# Patient Record
Sex: Male | Born: 2000 | Race: Black or African American | Hispanic: No | Marital: Single | Smoking: Never smoker
Health system: Southern US, Community
[De-identification: ages and names within clinical notes are randomized; demographics above are authoritative.]

## PROBLEM LIST (undated history)

## (undated) HISTORY — PX: WISDOM TOOTH EXTRACTION: SHX21

## (undated) HISTORY — PX: CIRCUMCISION: SHX1350

---
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
## 2018-05-29 DIAGNOSIS — Z00129 Encounter for routine child health examination without abnormal findings: Secondary | ICD-10-CM | POA: Diagnosis not present

## 2018-09-29 ENCOUNTER — Ambulatory Visit: Payer: Medicaid Other | Admitting: Family Medicine

## 2018-10-20 ENCOUNTER — Ambulatory Visit (INDEPENDENT_AMBULATORY_CARE_PROVIDER_SITE_OTHER): Payer: BLUE CROSS/BLUE SHIELD | Admitting: Family Medicine

## 2018-10-20 ENCOUNTER — Encounter: Payer: Self-pay | Admitting: Family Medicine

## 2018-10-20 VITALS — BP 126/70 | HR 72 | Temp 98.4°F | Ht 67.0 in | Wt 160.0 lb

## 2018-10-20 DIAGNOSIS — F419 Anxiety disorder, unspecified: Secondary | ICD-10-CM

## 2018-10-20 DIAGNOSIS — F32A Depression, unspecified: Secondary | ICD-10-CM

## 2018-10-20 DIAGNOSIS — F329 Major depressive disorder, single episode, unspecified: Secondary | ICD-10-CM

## 2018-10-20 DIAGNOSIS — Z7689 Persons encountering health services in other specified circumstances: Secondary | ICD-10-CM

## 2018-10-20 DIAGNOSIS — Z23 Encounter for immunization: Secondary | ICD-10-CM

## 2018-10-20 NOTE — Patient Instructions (Addendum)
Living With Anxiety    After being diagnosed with an anxiety disorder, you may be relieved to know why you have felt or behaved a certain way. It is natural to also feel overwhelmed about the treatment ahead and what it will mean for your life. With care and support, you can manage this condition and recover from it.  How to cope with anxiety  Dealing with stress  Stress is your body's reaction to life changes and events, both good and bad. Stress can last just a few hours or it can be ongoing. Stress can play a major role in anxiety, so it is important to learn both how to cope with stress and how to think about it differently.  Talk with your health care provider or a counselor to learn more about stress reduction. He or she may suggest some stress reduction techniques, such as:  Music therapy. This can include creating or listening to music that you enjoy and that inspires you.  Mindfulness-based meditation. This involves being aware of your normal breaths, rather than trying to control your breathing. It can be done while sitting or walking.  Centering prayer. This is a kind of meditation that involves focusing on a word, phrase, or sacred image that is meaningful to you and that brings you peace.  Deep breathing. To do this, expand your stomach and inhale slowly through your nose. Hold your breath for 3-5 seconds. Then exhale slowly, allowing your stomach muscles to relax.  Self-talk. This is a skill where you identify thought patterns that lead to anxiety reactions and correct those thoughts.  Muscle relaxation. This involves tensing muscles then relaxing them.  Choose a stress reduction technique that fits your lifestyle and personality. Stress reduction techniques take time and practice. Set aside 5-15 minutes a day to do them. Therapists can offer training in these techniques. The training may be covered by some insurance plans. Other things you can do to manage stress include:  Keeping a stress diary. This  can help you learn what triggers your stress and ways to control your response.  Thinking about how you respond to certain situations. You may not be able to control everything, but you can control your reaction.  Making time for activities that help you relax, and not feeling guilty about spending your time in this way.  Therapy combined with coping and stress-reduction skills provides the best chance for successful treatment.  Medicines  Medicines can help ease symptoms. Medicines for anxiety include:  Anti-anxiety drugs.  Antidepressants.  Beta-blockers.  Medicines may be used as the main treatment for anxiety disorder, along with therapy, or if other treatments are not working. Medicines should be prescribed by a health care provider.  Relationships  Relationships can play a big part in helping you recover. Try to spend more time connecting with trusted friends and family members. Consider going to couples counseling, taking family education classes, or going to family therapy. Therapy can help you and others better understand the condition.  How to recognize changes in your condition  Everyone has a different response to treatment for anxiety. Recovery from anxiety happens when symptoms decrease and stop interfering with your daily activities at home or work. This may mean that you will start to:  Have better concentration and focus.  Sleep better.  Be less irritable.  Have more energy.  Have improved memory.  It is important to recognize when your condition is getting worse. Contact your health care provider if your symptoms   interfere with home or work and you do not feel like your condition is improving.  Where to find help and support:  You can get help and support from these sources:  Self-help groups.  Online and community organizations.  A trusted spiritual leader.  Couples counseling.  Family education classes.  Family therapy.  Follow these instructions at home:  Eat a healthy diet that includes plenty  of vegetables, fruits, whole grains, low-fat dairy products, and lean protein. Do not eat a lot of foods that are high in solid fats, added sugars, or salt.  Exercise. Most adults should do the following:  Exercise for at least 150 minutes each week. The exercise should increase your heart rate and make you sweat (moderate-intensity exercise).  Strengthening exercises at least twice a week.  Cut down on caffeine, tobacco, alcohol, and other potentially harmful substances.  Get the right amount and quality of sleep. Most adults need 7-9 hours of sleep each night.  Make choices that simplify your life.  Take over-the-counter and prescription medicines only as told by your health care provider.  Avoid caffeine, alcohol, and certain over-the-counter cold medicines. These may make you feel worse. Ask your pharmacist which medicines to avoid.  Keep all follow-up visits as told by your health care provider. This is important.  Questions to ask your health care provider  Would I benefit from therapy?  How often should I follow up with a health care provider?  How long do I need to take medicine?  Are there any long-term side effects of my medicine?  Are there any alternatives to taking medicine?  Contact a health care provider if:  You have a hard time staying focused or finishing daily tasks.  You spend many hours a day feeling worried about everyday life.  You become exhausted by worry.  You start to have headaches, feel tense, or have nausea.  You urinate more than normal.  You have diarrhea.  Get help right away if:  You have a racing heart and shortness of breath.  You have thoughts of hurting yourself or others.  If you ever feel like you may hurt yourself or others, or have thoughts about taking your own life, get help right away. You can go to your nearest emergency department or call:  Your local emergency services (911 in the [place]  A suicide crisis helpline, such as the National Suicide Prevention Lifeline at  1-800-273-8255. This is open 24-hours a day.  Summary  Taking steps to deal with stress can help calm you.  Medicines cannot cure anxiety disorders, but they can help ease symptoms.  Family, friends, and partners can play a big part in helping you recover from an anxiety disorder.  This information is not intended to replace advice given to you by your health care provider. Make sure you discuss any questions you have with your health care provider.  Document Released: 09/24/2016 Document Revised: 09/24/2016 Document Reviewed: 09/24/2016  Elsevier Interactive Patient Education  2019 [employer]  Living With Depression  Everyone experiences occasional disappointment, sadness, and loss in their lives. When you are feeling down, blue, or sad for at least 2 weeks in a row, it may mean that you have depression. Depression can affect your thoughts and feelings, relationships, daily activities, and physical health. It is caused by changes in the way your brain functions. If you receive a diagnosis of depression, your health care provider will tell you which type of depression you have and what   treatment options are available to you.  If you are living with depression, there are ways to help you recover from it and also ways to prevent it from coming back.  How to cope with lifestyle changes  Coping with stress         Stress is your body's reaction to life changes and events, both good and bad. Stressful situations may include:  Getting married.  The death of a spouse.  Losing a job.  Retiring.  Having a baby.  Stress can last just a few hours or it can be ongoing. Stress can play a major role in depression, so it is important to learn both how to cope with stress and how to think about it differently.  Talk with your health care provider or a counselor if you would like to learn more about stress reduction. He or she may suggest some stress reduction techniques, such as:  Music therapy. This can include creating music  or listening to music. Choose music that you enjoy and that inspires you.  Mindfulness-based meditation. This kind of meditation can be done while sitting or walking. It involves being aware of your normal breaths, rather than trying to control your breathing.  Centering prayer. This is a kind of meditation that involves focusing on a spiritual word or phrase. Choose a word, phrase, or sacred image that is meaningful to you and that brings you peace.  Deep breathing. To do this, expand your stomach and inhale slowly through your nose. Hold your breath for 3-5 seconds, then exhale slowly, allowing your stomach muscles to relax.  Muscle relaxation. This involves intentionally tensing muscles then relaxing them.  Choose a stress reduction technique that fits your lifestyle and personality. Stress reduction techniques take time and practice to develop. Set aside 5-15 minutes a day to do them. Therapists can offer training in these techniques. The training may be covered by some insurance plans. Other things you can do to manage stress include:  Keeping a stress diary. This can help you learn what triggers your stress and ways to control your response.  Understanding what your limits are and saying no to requests or events that lead to a schedule that is too full.  Thinking about how you respond to certain situations. You may not be able to control everything, but you can control how you react.  Adding humor to your life by watching funny films or TV shows.  Making time for activities that help you relax and not feeling guilty about spending your time this way.    Medicines  Your health care provider may suggest certain medicines if he or she feels that they will help improve your condition. Avoid using alcohol and other substances that may prevent your medicines from working properly (may interact). It is also important to:  Talk with your pharmacist or health care provider about all the medicines that you take, their  possible side effects, and what medicines are safe to take together.  Make it your goal to take part in all treatment decisions (shared decision-making). This includes giving input on the side effects of medicines. It is best if shared decision-making with your health care provider is part of your total treatment plan.  If your health care provider prescribes a medicine, you may not notice the full benefits of it for 4-8 weeks. Most people who are treated for depression need to be on medicine for at least 6-12 months after they feel better. If you are   taking medicines as part of your treatment, do not stop taking medicines without first talking to your health care provider. You may need to have the medicine slowly decreased (tapered) over time to decrease the risk of harmful side effects.  Relationships  Your health care provider may suggest family therapy along with individual therapy and drug therapy. While there may not be family problems that are causing you to feel depressed, it is still important to make sure your family learns as much as they can about your mental health. Having your family's support can help make your treatment successful.  How to recognize changes in your condition  Everyone has a different response to treatment for depression. Recovery from major depression happens when you have not had signs of major depression for two months. This may mean that you will start to:  Have more interest in doing activities.  Feel less hopeless than you did 2 months ago.  Have more energy.  Overeat less often, or have better or improving appetite.  Have better concentration.  Your health care provider will work with you to decide the next steps in your recovery. It is also important to recognize when your condition is getting worse. Watch for these signs:  Having fatigue or low energy.  Eating too much or too little.  Sleeping too much or too little.  Feeling restless, agitated, or hopeless.  Having trouble  concentrating or making decisions.  Having unexplained physical complaints.  Feeling irritable, angry, or aggressive.  Get help as soon as you or your family members notice these symptoms coming back.  How to get support and help from others  How to talk with friends and family members about your condition    Talking to friends and family members about your condition can provide you with one way to get support and guidance. Reach out to trusted friends or family members, explain your symptoms to them, and let them know that you are working with a health care provider to treat your depression.  Financial resources  Not all insurance plans cover mental health care, so it is important to check with your insurance carrier. If paying for co-pays or counseling services is a problem, search for a local or county mental health care center. They may be able to offer public mental health care services at low or no cost when you are not able to see a private health care provider.  If you are taking medicine for depression, you may be able to get the generic form, which may be less expensive. Some makers of prescription medicines also offer help to patients who cannot afford the medicines they need.  Follow these instructions at home:    Get the right amount and quality of sleep.  Cut down on using caffeine, tobacco, alcohol, and other potentially harmful substances.  Try to exercise, such as walking or lifting small weights.  Take over-the-counter and prescription medicines only as told by your health care provider.  Eat a healthy diet that includes plenty of vegetables, fruits, whole grains, low-fat dairy products, and lean protein. Do not eat a lot of foods that are high in solid fats, added sugars, or salt.  Keep all follow-up visits as told by your health care provider. This is important.  Contact a health care provider if:  You stop taking your antidepressant medicines, and you have any of these  symptoms:  Nausea.  Headache.  Feeling lightheaded.  Chills and body aches.  Not being able   to sleep (insomnia).  You or your friends and family think your depression is getting worse.  Get help right away if:  You have thoughts of hurting yourself or others.  If you ever feel like you may hurt yourself or others, or have thoughts about taking your own life, get help right away. You can go to your nearest emergency department or call:  Your local emergency services (911 in the [place]  A suicide crisis helpline, such as the National Suicide Prevention Lifeline at 1-800-273-8255. This is open 24-hours a day.  Summary  If you are living with depression, there are ways to help you recover from it and also ways to prevent it from coming back.  Work with your health care team to create a management plan that includes counseling, stress management techniques, and healthy lifestyle habits.  This information is not intended to replace advice given to you by your health care provider. Make sure you discuss any questions you have with your health care provider.  Document Released: 09/02/2016 Document Revised: 09/02/2016 Document Reviewed: 09/02/2016  [employer]vier Interactive Patient Education  2019 [employer]

## 2018-10-20 NOTE — Progress Notes (Signed)
Patient presents to clinic today to f/u on ongoing concerns and establish care.  Pt is accompanied by his mother.  SUBJECTIVE: PMH: Pt is a 18 yo male with no sig pmh.  Pt was previously seen at [facility].  Last CPE August 2019.  Concerns about anxiety: -Per mom patient has an appointment tomorrow with a psychiatrist. -endorses occasional difficulty sleeping. -States may wake up at 4 in the morning or have difficulty falling asleep. -Patient notes his mind racing when trying to fall asleep -endorses overall good mood, energy is ok -does feel like anxiety is affecting his school work.  Allergies: NKDA  Past surgical history: None  Social history: Patient is single.  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  Patient denies alcohol, tobacco, drug use.  Health Maintenance: Immunizations --requesting influenza vaccine this visit  Family medical history: Mom-Alive, HTN Dad-Alive, history of cancer on his back that started as a keloid, status post removal.  HTN Sister, Rolm Gala, alive, asthma   History reviewed. No pertinent past medical history.  Past Surgical History:  Procedure Laterality Date  . CIRCUMCISION    . WISDOM TOOTH EXTRACTION      No current outpatient medications on file prior to visit.   No current facility-administered medications on file prior to visit.     No Known Allergies  Family History  Problem Relation Age of Onset  . Hypertension Father   . Chiari malformation Sister   . Headache Sister     Social History   Socioeconomic History  . Marital status: Single    Spouse name: Not on file  . Number of children: Not on file  . Years of education: Not on file  . Highest education level: Not on file  Occupational History  . Not on file  Social Needs  . Financial resource strain: Not on file  . Food insecurity:    Worry: Not on file    Inability: Not on file  .  Transportation needs:    Medical: Not on file    Non-medical: Not on file  Tobacco Use  . Smoking status: Never Smoker  . Smokeless tobacco: Never Used  Substance and Sexual Activity  . Alcohol use: No  . Drug use: No  . Sexual activity: Never  Lifestyle  . Physical activity:    Days per week: Not on file    Minutes per session: Not on file  . Stress: Not on file  Relationships  . Social connections:    Talks on phone: Not on file    Gets together: Not on file    Attends religious service: Not on file    Active member of club or organization: Not on file    Attends meetings of clubs or organizations: Not on file    Relationship status: Not on file  . Intimate partner violence:    Fear of current or ex partner: Not on file    Emotionally abused: Not on file    Physically abused: Not on file    Forced sexual activity: Not on file  Other Topics Concern  . Not on file  Social History Narrative   Yakub is in tenth grade at [employer]. He is doing well. He runs Track and plays Soccer.   Lives with his parents and younger sister.     ROS General: Denies fever, chills, night sweats, changes in weight, changes in appetite HEENT: Denies headaches, ear pain, changes in vision,  rhinorrhea, sore throat CV: Denies CP, palpitations, SOB, orthopnea Pulm: Denies SOB, cough, wheezing GI: Denies abdominal pain, nausea, vomiting, diarrhea, constipation GU: Denies dysuria, hematuria, frequency, vaginal discharge Msk: Denies muscle cramps, joint pains Neuro: Denies weakness, numbness, tingling Skin: Denies rashes, bruising Psych: Denies hallucinations  + anxiety and depression  BP 126/70 (BP Location: Left Arm, Patient Position: Sitting, Cuff Size: Normal)   Pulse 72   Temp 98.4 F (36.9 C) (Oral)   Ht 5\' 7"  (1.702 m)   Wt 160 lb (72.6 kg)   SpO2 98%   BMI 25.06 kg/m   Physical Exam Gen. Pleasant, well developed, well-nourished, in NAD HEENT - [place]/AT, PERRL, EOMI, conjunctive  clear, no scleral icterus, no nasal drainage, pharynx without erythema or exudate. Lungs: no use of accessory muscles, CTAB, no wheezes, rales or rhonchi Cardiovascular: RRR, No r/g/m, no peripheral edema Neuro:  A&Ox3, CN II-XII intact, normal gait Skin:  Warm, dry, intact, no lesions  No results found for this or any previous visit (from the past 2160 hour(s)).  Assessment/Plan: Anxiety and depression -PHQ 9 score 16 -Gad 7 score 15 -Discussed various options including therapy and medication -Patient has an appointment tomorrow with psychiatry.  Encouraged to keep this appointment. -Given handout on ways to manage stress, anxiety, depression. -Given RTC or ED precautions  Encounter to establish care -We reviewed the PMH, PSH, FH, SH, Meds and Allergies. -We provided refills for any medications we will prescribe as needed. -We addressed current concerns per orders and patient instructions. -We have asked for records for pertinent exams, studies, vaccines and notes from previous providers. -We have advised patient to follow up per instructions below.  Need for immunization against influenza  - Plan: Flu Vaccine QUAD 36+ mos IM   Follow-up in the next few months when patient is on spring break.  Abbe AmsterdamShannon Banks, MD

## 2019-01-21 ENCOUNTER — Other Ambulatory Visit: Payer: Self-pay

## 2019-01-21 ENCOUNTER — Ambulatory Visit (INDEPENDENT_AMBULATORY_CARE_PROVIDER_SITE_OTHER): Payer: BLUE CROSS/BLUE SHIELD | Admitting: Family Medicine

## 2019-01-21 ENCOUNTER — Encounter: Payer: Self-pay | Admitting: Family Medicine

## 2019-01-21 DIAGNOSIS — L700 Acne vulgaris: Secondary | ICD-10-CM

## 2019-01-21 MED ORDER — BENZOYL PEROXIDE 2.5 % EX LIQD: CUTANEOUS | 1 refills | Status: AC

## 2019-01-21 NOTE — Progress Notes (Signed)
Virtual Visit via Video Note  I connected with Edwin Castro on 01/21/19 at  2:30 PM EDT by a video enabled telemedicine application and verified that I am speaking with the correct person using two identifiers.  Location patient: home Location provider:home office Persons participating in the virtual visit: patient, provider  I discussed the limitations of evaluation and management by telemedicine and the availability of in person appointments. The patient expressed understanding and agreed to proceed.   HPI: Pt with increased acne since being home from college 2/2 COVID-19.  R side of face worse.  Notes painful pustules and dark marks on skin.  Drinking more water.  Not drinking sodas Tried numerous OTC products.  A few seemed to make skin worse.  Pt has been dealing with acne for some time.  In the past saw Dermatology.  Pt does note some increased stress.  Adjusting to doing work online/being home.    ROS: See pertinent positives and negatives per HPI.  No past medical history on file.  Past Surgical History:  Procedure Laterality Date  . CIRCUMCISION    . WISDOM TOOTH EXTRACTION      Family History  Problem Relation Age of Onset  . Hypertension Father   . Chiari malformation Sister   . 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  No current outpatient medications on file.  EXAM:  VITALS per patient if applicable:  RR between 12-20 bpm  GENERAL: alert, oriented, appears well and in no acute distress  HEENT: atraumatic, conjunctiva clear, no obvious abnormalities on inspection of external nose and ears  NECK: normal movements of the head and neck  LUNGS: on inspection no signs of respiratory distress, breathing rate appears normal, no obvious gross SOB, gasping or wheezing  CV: no obvious cyanosis  MS: moves all visible extremities without noticeable abnormality  SKIN:  Numerous closed comedones on forehead and cheeks with  mild erythema.  Several larger pustules on lateral R side of face.    PSYCH/NEURO: pleasant and cooperative, no obvious depression or anxiety, speech and thought processing grossly intact  ASSESSMENT AND PLAN:  Discussed the following assessment and plan:  Acne vulgaris  -discussed cleansing skin with a gentle cleanser such as cetaphil BID and using a good moisturizer. -ok to use vitamin E oil on hyperpigmented areas -continue staying hydrated with water. - Plan: Benzoyl Peroxide 2.5 % LIQD, Ambulatory referral to Dermatology -f/u prn  I discussed the assessment and treatment plan with the patient. The patient was provided an opportunity to ask questions and all were answered. The patient agreed with the plan and demonstrated an understanding of the instructions.   The patient was advised to call back or seek an in-person evaluation if the symptoms worsen or if the condition fails to improve as anticipated.   Deeann Saint, MD

## 2019-01-27 ENCOUNTER — Other Ambulatory Visit: Payer: Self-pay

## 2019-01-27 ENCOUNTER — Telehealth: Payer: Self-pay | Admitting: Family Medicine

## 2019-01-27 ENCOUNTER — Encounter: Payer: Self-pay | Admitting: Family Medicine

## 2019-01-27 ENCOUNTER — Ambulatory Visit: Payer: BLUE CROSS/BLUE SHIELD | Admitting: Family Medicine

## 2019-01-27 DIAGNOSIS — L989 Disorder of the skin and subcutaneous tissue, unspecified: Secondary | ICD-10-CM

## 2019-01-27 DIAGNOSIS — L700 Acne vulgaris: Secondary | ICD-10-CM

## 2019-01-27 DIAGNOSIS — K612 Anorectal abscess: Secondary | ICD-10-CM | POA: Diagnosis not present

## 2019-01-27 NOTE — Progress Notes (Signed)
Virtual Visit via Video Note  I connected with on 01/27/19 at  3:00 PM EDT by a video enabled telemedicine application and verified that I am speaking with the correct person using two identifiers.  Location patient: home Location provider: home office Persons participating in the virtual visit: patient, provider.  Pt's mother nearby.  I discussed the limitations of evaluation and management by telemedicine and the availability of in person appointments. The patient expressed understanding and agreed to proceed.   HPI: Pt with a cyst near his bottom.  States the area is not painful, but more uncomfortable.  Has been present x several days, seems to be getting larger.  Pt denies fever, chills, n/v.  Pt has not tried anything for the area.  Pt's mother notes she was unable to get the rx for benzoyl peroxide from the pharmacy.  States pharmacy said they tried to contact the office.  ROS: See pertinent positives and negatives per HPI.  No past medical history on file.  Past Surgical History:  Procedure Laterality Date  . CIRCUMCISION    . WISDOM TOOTH EXTRACTION      Family History  Problem Relation Age of Onset  . Hypertension Father   . Chiari malformation Sister   . Headache Sister     SOCIAL HX:    Current Outpatient Medications:  .  Benzoyl Peroxide 2.5 % LIQD, Use to wash face daily., Disp: 156 g, Rfl: 1  EXAM:  VITALS per patient if applicable:  RR between 12-20 bpm  GENERAL: alert, oriented, appears well and in no acute distress  HEENT: atraumatic, conjunctiva clear, no obvious abnormalities on inspection of external nose and ears  NECK: normal movements of the head and neck  LUNGS: on inspection no signs of respiratory distress, breathing rate appears normal, no obvious gross SOB, gasping or wheezing  CV: no obvious cyanosis  MS: moves all visible extremities without noticeable abnormality  PSYCH/NEURO: pleasant and cooperative, no obvious depression or  anxiety, speech and thought processing grossly intact  ASSESSMENT AND PLAN:  Discussed the following assessment and plan:  Skin lesion  -possible cyst vs abscess. -discussed may need I&D -will see pt in office tomorrow at 3:00pm to assess lesion  Pt's pharmacy called.  Benzoyl peroxide not available, but panoxyl peroxide OTC.  Will let pt know.  I discussed the assessment and treatment plan with the patient. The patient was provided an opportunity to ask questions and all were answered. The patient agreed with the plan and demonstrated an understanding of the instructions.   The patient was advised to call back or seek an in-person evaluation if the symptoms worsen or if the condition fails to improve as anticipated.   Deeann Saint, MD

## 2019-01-27 NOTE — Telephone Encounter (Unsigned)
Copied from CRM [id number]. Topic: Quick Communication - Rx Refill/Question >> Jan 27, 2019 11:41 AM [name] E wrote: Medication: Benzoyl Peroxide 2.5 % LIQD   Has the patient contacted their pharmacy? Yes- Pharmacy doesn't have any of this med and needs an alternative sent in/Pharmacy stated they attempted to contact office with no response/ please advise   Preferred Pharmacy (with phone number or street name):   Agent: Please be advised that RX refills may take up to 3 business days. We ask that you follow-up with your pharmacy.

## 2019-01-28 ENCOUNTER — Other Ambulatory Visit: Payer: Self-pay

## 2019-01-28 ENCOUNTER — Encounter: Payer: Self-pay | Admitting: Family Medicine

## 2019-01-28 ENCOUNTER — Ambulatory Visit (INDEPENDENT_AMBULATORY_CARE_PROVIDER_SITE_OTHER): Payer: BLUE CROSS/BLUE SHIELD | Admitting: Family Medicine

## 2019-01-28 VITALS — BP 128/86 | HR 72 | Temp 98.4°F | Wt 160.0 lb

## 2019-01-28 DIAGNOSIS — K612 Anorectal abscess: Secondary | ICD-10-CM | POA: Diagnosis not present

## 2019-01-28 MED ORDER — DOXYCYCLINE HYCLATE 100 MG PO TABS: 100.0000 mg | ORAL_TABLET | Freq: Two times a day (BID) | ORAL | 0 refills | Status: AC

## 2019-01-28 NOTE — Patient Instructions (Signed)
Anorectal Abscess An abscess is an infected area that contains a collection of pus. An anorectal abscess is an abscess that is near the opening of the anus or around the rectum. Without treatment, an anorectal abscess can become larger and cause other problems, such as a more serious body-wide infection or pain, especially during bowel movements. What are the causes? This condition is caused by plugged glands or an infection in one of these areas:  The anus.  The area between the anus and the scrotum in males or between the anus and the vagina in females (perineum). What increases the risk? The following factors may make you more likely to develop this condition:  Diabetes or inflammatory bowel disease.  Having a body defense system (immune system) that is weak.  Engaging in anal sex.  Having a sexually transmitted infection (STI).  Certain kinds of cancer, such as rectal carcinoma, leukemia, or lymphoma. What are the signs or symptoms? The main symptom of this condition is pain. The pain may be a throbbing pain that gets worse during bowel movements. Other symptoms include:  Swelling and redness in the area of the abscess. The redness may go beyond the abscess and appear as a red streak on the skin.  A visible, painful lump, or a lump that can be felt when touched.  Bleeding or pus-like discharge from the area.  Fever.  General weakness.  Constipation.  Diarrhea. How is this diagnosed? This condition is diagnosed based on your medical history and a physical exam of the affected area.  This may involve examining the rectal area with a gloved hand (digital rectal exam).  Sometimes, the health care provider needs to look into the rectum using a probe, scope, or imaging test.  For women, it may require a careful vaginal exam. How is this treated? Treatment for this condition may include:  Incision and drainage surgery. This involves making an incision over the abscess to  drain the pus.  Medicines, including antibiotic medicine, pain medicine, stool softeners, or laxatives. Follow these instructions at home: Medicines  Take over-the-counter and prescription medicines only as told by your health care provider.  If you were prescribed an antibiotic medicine, use it as told by your health care provider. Do not stop using the antibiotic even if you start to feel better.  Do not drive or use heavy machinery while taking prescription pain medicine. Wound care   If gauze was used in the abscess, follow instructions from your health care provider about removing or changing the gauze. It can usually be removed in 2-3 days.  Wash your hands with soap and water before you remove or change your gauze. If soap and water are not available, use hand sanitizer.  If one or more drains were placed in the abscess cavity, be careful not to pull at them. Your health care provider will tell you how long they need to remain in place.  Check your incision area every day for signs of infection. Check for: ? More redness, swelling, or pain. ? More fluid or blood. ? Warmth. ? Pus or a bad smell. Managing pain, stiffness, and swelling   Take a sitz bath 3-4 times a day and after bowel movements. This will help reduce pain and swelling.  To relieve pain, try sitting: ? On a heating pad with the setting on low. ? On an inflatable donut-shaped cushion.  If directed, put ice on the affected area: ? Put ice in a plastic bag. ? Place   a towel between your skin and the bag. ? Leave the ice on for 20 minutes, 2-3 times a day. General instructions  Follow any diet instructions given by your health care provider.  Keep all follow-up visits as told by your health care provider. This is important. Contact a health care provider if you have:  Bleeding from your incision.  Pain, swelling, or redness that does not improve or gets worse.  Trouble passing stool or urine.   Symptoms that return after treatment. Get help right away if you:  Have problems moving or using your legs.  Have severe or increasing pain.  Have swelling in the affected area that suddenly gets worse.  Have a large increase in bleeding or passing of pus.  Develop chills or a fever. Summary  An anorectal abscess is an abscess that is near the opening of the anus or around the rectum. An abscess is an infected area that contains a collection of pus.  The main symptom of this condition is pain. It may be a throbbing pain that gets worse during bowel movements.  Treatment for an anorectal abscess may include surgery to drain the pus from the abscess. Medicines and sitz baths may also be a part of your treatment plan. This information is not intended to replace advice given to you by your health care provider. Make sure you discuss any questions you have with your health care provider. Document Released: 09/27/2000 Document Revised: 11/06/2017 Document Reviewed: 11/06/2017 Elsevier Interactive Patient Education  2019 [employer] Inc[employer]  Incision and Drainage, Care After Refer to this sheet in the next few weeks. These instructions provide you with information about caring for yourself after your procedure. Your health care provider may also give you more specific instructions. Your treatment has been planned according to current medical practices, but problems sometimes occur. Call your health care provider if you have any problems or questions after your procedure. What can I expect after the procedure? After the procedure, it is common to have:  Pain or discomfort around your incision site.  Drainage from your incision. Follow these instructions at home:  Take over-the-counter and prescription medicines only as told by your health care provider.  If you were prescribed an antibiotic medicine, take it as told by your health care provider.Do not stop taking the antibiotic even if you  start to feel better.  Followinstructions from your health care provider about: ? How to take care of your incision. ? When and how you should change your packing and bandage (dressing). Wash your hands with soap and water before you change your dressing. If soap and water are not available, use hand sanitizer. ? When you should remove your dressing.  Do not take baths, swim, or use a hot tub until your health care provider approves.  Keep all follow-up visits as told by your health care provider. This is important.  Check your incision area every day for signs of infection. Check for: ? More redness, swelling, or pain. ? More fluid or blood. ? Warmth. ? Pus or a bad smell. Contact a health care provider if:  Your cyst or abscess returns.  You have a fever.  You have more redness, swelling, or pain around your incision.  You have more fluid or blood coming from your incision.  Your incision feels warm to the touch.  You have pus or a bad smell coming from your incision. Get help right away if:  You have severe pain or bleeding.  You cannot eat or drink without vomiting.  You have decreased urine output.  You become short of breath.  You have chest pain.  You cough up blood.  The area where the incision and drainage occurred becomes numb or it tingles. This information is not intended to replace advice given to you by your health care provider. Make sure you discuss any questions you have with your health care provider. Document Released: 12/23/2011 Document Revised: 03/01/2016 Document Reviewed: 07/21/2015 Elsevier Interactive Patient Education  2019 [employer].

## 2019-01-28 NOTE — Progress Notes (Signed)
Acute Office Visit  Subjective:    Patient ID: Edwin Castro, male    DOB: 13-Jan-2001, 18 y.o.   MRN: [id number]   HPI Pt seen 01/27/19 for acute concern via evisit.  Pt in office today for possible I&D.    Past Surgical History:  Procedure Laterality Date  . CIRCUMCISION    . WISDOM TOOTH EXTRACTION      Family History  Problem Relation Age of Onset  . Hypertension Father   . Chiari malformation Sister   . Headache Sister     Social History   Socioeconomic History  . Marital status: Single    Spouse name: Not on file  . Number of children: Not on file  . Years of education: Not on file  . Highest education level: Not on file  Occupational History  . Not on file  Social Needs  . Financial resource strain: Not on file  . Food insecurity:    Worry: Not on file    Inability: Not on file  . Transportation needs:    Medical: Not on file    Non-medical: Not on file  Tobacco Use  . Smoking status: Never Smoker  . Smokeless tobacco: Never Used  Substance and Sexual Activity  . Alcohol use: No  . Drug use: No  . Sexual activity: Never  Lifestyle  . Physical activity:    Days per week: Not on file    Minutes per session: Not on file  . Stress: Not on file  Relationships  . Social connections:    Talks on phone: Not on file    Gets together: Not on file    Attends religious service: Not on file    Active member of club or organization: Not on file    Attends meetings of clubs or organizations: Not on file    Relationship status: Not on file  . Intimate partner violence:    Fear of current or ex partner: Not on file    Emotionally abused: Not on file    Physically abused: Not on file    Forced sexual activity: Not on file  Other Topics Concern  . Not on file  Social History Narrative   Edwin Castro is in tenth grade at [employer]. He is doing well. He runs Track and plays Soccer.   Lives with his parents and younger sister.     Outpatient Medications  Prior to Visit  Medication Sig Dispense Refill  . Benzoyl Peroxide 2.5 % LIQD Use to wash face daily. 156 g 1   No facility-administered medications prior to visit.    Allergies: NKDA  Incision and Drainage Procedure Note  Pre-operative Diagnosis: abscess  Post-operative Diagnosis: same  Indications: abscess  Anesthesia: 1% plain lidocaine  Procedure Details  The procedure, risks and complications have been discussed in detail (including, but not limited to airway compromise, infection, bleeding) with the patient, and the patient has signed consent to the procedure.  The skin was sterilely prepped and draped over the affected area in the usual fashion. After adequate local anesthesia, I&D with a #11 blade was performed on the left perianal area . Purulent drainage: present The patient was observed until stable.  EBL: minimal cc's  Condition: Tolerated procedure well and Stable   Complications: None.  A/P:  Abscess -consent obtained, I&D performed, pt tolerated procedure well -rx for Doxycycline 100 mg BID x 10 days given -given RTC precautions -f/u prn in the next few days  Abbe Amsterdam,  MD 01/28/19 Time 1549

## 2019-01-29 NOTE — Telephone Encounter (Signed)
Pt was seen at the office and is aware that pharmacy does not carry the particular Rx, Pharmacy advise pt on OTC Rx.

## 2019-05-11 ENCOUNTER — Telehealth: Payer: Self-pay | Admitting: Family Medicine

## 2019-05-11 NOTE — Telephone Encounter (Signed)
Pts mother called and stated that the pt need to bring in a form for school pt has a CPE scheduled for 06/23/2019 at 11:00.  She state that the pt will drop off the form and if a different appointment is needed for the form she would like to have a call.

## 2019-06-23 ENCOUNTER — Other Ambulatory Visit: Payer: Self-pay

## 2019-06-23 ENCOUNTER — Encounter: Payer: Self-pay | Admitting: Family Medicine

## 2019-06-23 ENCOUNTER — Ambulatory Visit (INDEPENDENT_AMBULATORY_CARE_PROVIDER_SITE_OTHER): Payer: Medicaid Other | Admitting: Family Medicine

## 2019-06-23 VITALS — BP 120/76 | HR 58 | Temp 98.5°F | Wt 157.4 lb

## 2019-06-23 DIAGNOSIS — F419 Anxiety disorder, unspecified: Secondary | ICD-10-CM | POA: Diagnosis not present

## 2019-06-23 DIAGNOSIS — Z23 Encounter for immunization: Secondary | ICD-10-CM | POA: Diagnosis not present

## 2019-06-23 DIAGNOSIS — F329 Major depressive disorder, single episode, unspecified: Secondary | ICD-10-CM | POA: Diagnosis not present

## 2019-06-23 DIAGNOSIS — L700 Acne vulgaris: Secondary | ICD-10-CM

## 2019-06-23 DIAGNOSIS — Z0000 Encounter for general adult medical examination without abnormal findings: Secondary | ICD-10-CM | POA: Diagnosis not present

## 2019-06-23 LAB — T4, FREE: Free T4: 0.94 ng/dL (ref 0.60–1.60)

## 2019-06-23 LAB — CBC
HCT: 39.1 % (ref 36.0–49.0)
Hemoglobin: 12.8 g/dL (ref 12.0–16.0)
MCHC: 32.8 g/dL (ref 31.0–37.0)
MCV: 81.9 fl (ref 78.0–98.0)
Platelets: 173 10*3/uL (ref 150.0–575.0)
RBC: 4.78 Mil/uL (ref 3.80–5.70)
RDW: 12.9 % (ref 11.4–15.5)
WBC: 3.9 10*3/uL — ABNORMAL LOW (ref 4.5–13.5)

## 2019-06-23 LAB — BASIC METABOLIC PANEL
BUN: 10 mg/dL (ref 6–23)
CO2: 30 mEq/L (ref 19–32)
Calcium: 10 mg/dL (ref 8.4–10.5)
Chloride: 102 mEq/L (ref 96–112)
Creatinine, Ser: 0.87 mg/dL (ref 0.40–1.50)
GFR: 135.64 mL/min (ref >60.00)
Glucose, Bld: 79 mg/dL (ref 70–99)
Potassium: 4.5 mEq/L (ref 3.5–5.1)
Sodium: 138 mEq/L (ref 135–145)

## 2019-06-23 LAB — TSH: TSH: 1.61 u[IU]/mL (ref 0.40–5.00)

## 2019-06-23 NOTE — Progress Notes (Signed)
Subjective:     Edwin Castro is a 18 y.o. male and is here for a comprehensive physical exam. The patient reports problems - acne.  Notes cystic lesions on temples and around mouth.  Tried numerous OTC cleansers and facial masks.  Also tried Benzoyl peroxide and doxycycline.  Washing face BID.  Pt adjusting to college classes online.  States school is hopeful to have in person classes in the Spring.  Pt notes mood is up and down.  Sleep is good. And energy is good.  Social History   Socioeconomic History  . Marital status: Single    Spouse name: Not on file  . Number of children: Not on file  . Years of education: Not on file  . Highest education level: Not on file  Occupational History  . Not on file  Social Needs  . Financial resource strain: Not on file  . Food insecurity    Worry: Not on file    Inability: Not on file  . Transportation needs    Medical: Not on file    Non-medical: Not on file  Tobacco Use  . Smoking status: Never Smoker  . Smokeless tobacco: Never Used  Substance and Sexual Activity  . Alcohol use: No  . Drug use: No  . Sexual activity: Never  Lifestyle  . Physical activity    Days per week: Not on file    Minutes per session: Not on file  . Stress: Not on file  Relationships  . Social Musicianconnections    Talks on phone: Not on file    Gets together: Not on file    Attends religious service: Not on file    Active member of club or organization: Not on file    Attends meetings of clubs or organizations: Not on file    Relationship status: Not on file  . Intimate partner violence    Fear of current or ex partner: Not on file    Emotionally abused: Not on file    Physically abused: Not on file    Forced sexual activity: Not on file  Other Topics Concern  . Not on file  Social History Narrative   Edwin Castro is in tenth grade at [employer]. He is doing well. He runs Track and plays Soccer.   Lives with his parents and younger sister.    Health  Maintenance  Topic Date Due  . HIV Screening  09/03/2014  . TETANUS/TDAP  09/03/2018  . INFLUENZA VACCINE  05/15/2019    The following portions of the patient's history were reviewed and updated as appropriate: allergies, current medications, past family history, past medical history, past social history, past surgical history and problem list.  Review of Systems Pertinent items noted in HPI and remainder of comprehensive ROS otherwise negative.   Objective:    BP 120/76 (BP Location: Right Arm, Patient Position: Sitting, Cuff Size: Normal)   Pulse (!) 58   Temp 98.5 F (36.9 C) (Oral)   Wt 157 lb 6.4 oz (71.4 kg)   SpO2 100%   BMI 24.65 kg/m  General appearance: alert, cooperative and no distress Head: Normocephalic, without obvious abnormality, atraumatic Eyes: conjunctivae/corneas clear. PERRL, EOM's intact. Fundi benign. Ears: normal TM's and external ear canals both ears Nose: Nares normal. Septum midline. Mucosa normal. No drainage or sinus tenderness. Throat: lips, mucosa, and tongue normal; teeth and gums normal Neck: no adenopathy, no carotid bruit, no JVD, supple, symmetrical, trachea midline and thyroid not enlarged, symmetric, no  tenderness/mass/nodules Lungs: clear to auscultation bilaterally Heart: regular rate and rhythm, S1, S2 normal, no murmur, click, rub or gallop Abdomen: soft, non-tender; bowel sounds normal; no masses,  no organomegaly Extremities: extremities normal, atraumatic, no cyanosis or edema Pulses: 2+ and symmetric Skin: Skin color, texture, turgor normal. No rashes or lesions Lymph nodes: Cervical, supraclavicular, and axillary nodes normal. Neurologic: Alert and oriented X 3, normal strength and tone. Normal symmetric reflexes. Normal coordination and gait    Assessment:    Healthy male exam.      Plan:     Anticipatory guidance given including wearing seatbelts, smoke detectors in the home, increasing physical activity, increasing p.o.  intake of water and vegetables. -will obtain labs -given handout -next CPE in 1 yr See After Visit Summary for Counseling Recommendations    Cystic Acne -offered rx for differn gel.  Pt declines at this time. -continue cleansing face BID -referral to Derm  F/u prn  Grier Mitts, MD

## 2019-06-23 NOTE — Patient Instructions (Signed)
Preventive Care 23-20 Years Old, Male Preventive care refers to lifestyle choices and visits with your health care provider that can promote health and wellness. At this stage in your life, you may start seeing a primary care physician instead of a pediatrician. Your health care is now your responsibility. Preventive care for young adults includes:  A yearly physical exam. This is also called an annual wellness visit.  Regular dental and eye exams.  Immunizations.  Screening for certain conditions.  Healthy lifestyle choices, such as diet and exercise. What can I expect for my preventive care visit? Physical exam Your health care provider may check:  Height and weight. These may be used to calculate body mass index (BMI), which is a measurement that tells if you are at a healthy weight.  Heart rate and blood pressure.  Body temperature. Counseling Your health care provider may ask you questions about:  Past medical problems and family medical history.  Alcohol, tobacco, and drug use.  Home and relationship well-being.  Access to firearms.  Emotional well-being.  Diet, exercise, and sleep habits.  Sexual activity and sexual health. What immunizations do I need?  Influenza (flu) vaccine  This is recommended every year. Tetanus, diphtheria, and pertussis (Tdap) vaccine  You may need a Td booster every 10 years. Varicella (chickenpox) vaccine  You may need this vaccine if you have not already been vaccinated. Human papillomavirus (HPV) vaccine  If recommended by your health care provider, you may need three doses over 6 months. Measles, mumps, and rubella (MMR) vaccine  You may need at least one dose of MMR. You may also need a second dose. Meningococcal conjugate (MenACWY) vaccine  One dose is recommended if you are 68-54 years old and a Market researcher living in a residence hall, or if you have one of several medical conditions. You may also need  additional booster doses. Pneumococcal conjugate (PCV13) vaccine  You may need this if you have certain conditions and were not previously vaccinated. Pneumococcal polysaccharide (PPSV23) vaccine  You may need one or two doses if you smoke cigarettes or if you have certain conditions. Hepatitis A vaccine  You may need this if you have certain conditions or if you travel or work in places where you may be exposed to hepatitis A. Hepatitis B vaccine  You may need this if you have certain conditions or if you travel or work in places where you may be exposed to hepatitis B. Haemophilus influenzae type b (Hib) vaccine  You may need this if you have certain risk factors. You may receive vaccines as individual doses or as more than one vaccine together in one shot (combination vaccines). Talk with your health care provider about the risks and benefits of combination vaccines. What tests do I need? Blood tests  Lipid and cholesterol levels. These may be checked every 5 years starting at age 24.  Hepatitis C test.  Hepatitis B test. Screening  Genital exam to check for testicular cancer or hernias.  Sexually transmitted disease (STD) testing, if you are at risk. Other tests  Tuberculosis skin test.  Vision and hearing tests.  Skin exam. Follow these instructions at home: Eating and drinking   Eat a diet that includes fresh fruits and vegetables, whole grains, lean protein, and low-fat dairy products.  Drink enough fluid to keep your urine pale yellow.  Do not drink alcohol if: ? Your health care provider tells you not to drink. ? You are under the legal drinking  age. In the [place] the legal drinking age is 28.  If you drink alcohol: ? Limit how much you have to 0-2 drinks a day. ? Be aware of how much alcohol is in your drink. In the [place] one drink equals one 12 oz bottle of beer (355 mL), one 5 oz glass of wine (148 mL), or one 1 oz glass of hard liquor (44 mL).  Lifestyle  Take daily care of your teeth and gums.  Stay active. Exercise at least 30 minutes 5 or more days of the week.  Do not use any products that contain nicotine or tobacco, such as cigarettes, e-cigarettes, and chewing tobacco. If you need help quitting, ask your health care provider.  Do not use drugs.  If you are sexually active, practice safe sex. Use a condom or other form of protection to prevent STIs (sexually transmitted infections).  Find healthy ways to cope with stress, such as: ? Meditation, yoga, or listening to music. ? Journaling. ? Talking to a trusted person. ? Spending time with friends and family. Safety  Always wear your seat belt while driving or riding in a vehicle.  Do not drive if you have been drinking alcohol.  Do not ride with someone who has been drinking.  Do not drive when you are tired or distracted.  Do not text while driving.  Wear a helmet and other protective equipment during sports activities.  If you have firearms in your house, make sure you follow all gun safety procedures.  Seek help if you have been bullied, physically abused, or sexually abused.  Use the Internet responsibly to avoid dangers such as online bullying and online sex predators. What's next?  Go to your health care provider once a year for a well check visit.  Ask your health care provider how often you should have your eyes and teeth checked.  Stay up to date on all vaccines. This information is not intended to replace advice given to you by your health care provider. Make sure you discuss any questions you have with your health care provider. Document Released: 02/15/2016 Document Revised: 09/24/2018 Document Reviewed: 09/24/2018 Elsevier Patient Education  2020 [employer].  Acne  Acne is a skin problem that causes pimples and other skin changes. The skin has many tiny openings called pores. Each pore contains an oil gland. Oil glands make an oily  substance that is called sebum. Acne occurs when the pores in the skin get blocked. The pores may become infected with bacteria, or they may become red, sore, and swollen. Acne is a common skin problem, especially for teenagers. It often occurs on the face, neck, chest, upper arms, and back. Acne usually goes away over time. What are the causes? Acne is caused when oil glands get blocked with sebum, dead skin cells, and dirt. The bacteria that are normally found in the oil glands then multiply and cause inflammation. Acne is commonly triggered by changes in your hormones. These hormonal changes can cause the oil glands to get bigger and to make more sebum. Factors that can make acne worse include:  Hormone changes during: ? Adolescence. ? Women's menstrual cycles. ? Pregnancy.  Oil-based cosmetics and hair products.  Stress.  Hormone problems that are caused by certain diseases.  Certain medicines.  Pressure from headbands, backpacks, or shoulder pads.  Exposure to certain oils and chemicals.  Eating a diet high in carbohydrates that quickly turn to sugar. These include dairy products, desserts, and chocolates. What  increases the risk? This condition is more likely to develop in:  Teenagers.  People who have a family history of acne. What are the signs or symptoms? Symptoms include:  Small, red bumps (pimples or papules).  Whiteheads.  Blackheads.  Small, pus-filled pimples (pustules).  Big, red pimples or pustules that feel tender. More severe acne can cause:  An abscess. This is an infected area that contains a collection of pus.  Cysts. These are hard, painful, fluid-filled sacs.  Scars. These can happen after large pimples heal. How is this diagnosed? This condition is diagnosed with a medical history and physical exam. Blood tests may also be done. How is this treated? Treatment for this condition can vary depending on the severity of your acne. Treatment may  include:  Creams and lotions that prevent oil glands from clogging.  Creams and lotions that treat or prevent infections and inflammation.  Antibiotic medicines that are applied to the skin or taken as a pill.  Pills that decrease sebum production.  Birth control pills.  Light or laser treatments.  Injections of medicine into the affected areas.  Chemicals that cause peeling of the skin.  Surgery. Your health care provider will also recommend the best way to take care of your skin. Good skin care is the most important part of treatment. Follow these instructions at home: Skin care Take care of your skin as told by your health care provider. You may be told to do these things:  Wash your skin gently at least two times each day, as well as: ? After you exercise. ? Before you go to bed.  Use mild soap.  Apply a water-based skin moisturizer after you wash your skin.  Use a sunscreen or sunblock with SPF 30 or greater. This is especially important if you are using acne medicines.  Choose cosmetics that will not block your oil glands (are noncomedogenic). Medicines  Take over-the-counter and prescription medicines only as told by your health care provider.  If you were prescribed an antibiotic medicine, apply it or take it as told by your health care provider. Do not stop using the antibiotic even if your condition improves. General instructions  Keep your hair clean and off your face. If you have oily hair, shampoo your hair regularly or daily.  Avoid wearing tight headbands or hats.  Avoid picking or squeezing your pimples. That can make your acne worse and cause scarring.  Shave gently and only when necessary.  Keep a food journal to figure out if any foods are linked to your acne. Avoid dairy products, desserts, and chocolates.  Take steps to manage and reduce stress.  Keep all follow-up visits as told by your health care provider. This is important. Contact a  health care provider if:  Your acne is not better after eight weeks.  Your acne gets worse.  You have a large area of skin that is red or tender.  You think that you are having side effects from any acne medicine. Summary  Acne is a skin problem that causes pimples and other skin changes. Acne is a common skin problem, especially for teenagers. Acne usually goes away over time.  Acne is commonly triggered by changes in your hormones. There are many other causes, such as stress, diet, and certain medicines.  Follow your health care provider's instructions for how to take care of your skin. Good skin care is the most important part of treatment.  Take over-the-counter and prescription medicines only as  told by your health care provider.  Contact your health care provider if you think that you are having side effects from any acne medicine. This information is not intended to replace advice given to you by your health care provider. Make sure you discuss any questions you have with your health care provider. Document Released: 09/27/2000 Document Revised: 02/10/2018 Document Reviewed: 02/10/2018 Elsevier Patient Education  2020 [employer].

## 2019-06-23 NOTE — Addendum Note (Signed)
Addended by: Marya Amsler R on: 06/23/2019 11:57 AM   Modules accepted: Orders

## 2019-07-15 ENCOUNTER — Other Ambulatory Visit: Payer: Self-pay

## 2019-07-15 DIAGNOSIS — L700 Acne vulgaris: Secondary | ICD-10-CM

## 2020-01-12 ENCOUNTER — Ambulatory Visit (INDEPENDENT_AMBULATORY_CARE_PROVIDER_SITE_OTHER): Payer: BC Managed Care – PPO | Admitting: Family Medicine

## 2020-01-12 ENCOUNTER — Encounter: Payer: Self-pay | Admitting: Family Medicine

## 2020-01-12 ENCOUNTER — Other Ambulatory Visit: Payer: Self-pay

## 2020-01-12 VITALS — BP 116/82 | HR 82 | Temp 96.7°F | Ht 67.0 in | Wt 161.8 lb

## 2020-01-12 DIAGNOSIS — F32A Depression, unspecified: Secondary | ICD-10-CM

## 2020-01-12 DIAGNOSIS — Z0000 Encounter for general adult medical examination without abnormal findings: Secondary | ICD-10-CM

## 2020-01-12 DIAGNOSIS — F329 Major depressive disorder, single episode, unspecified: Secondary | ICD-10-CM

## 2020-01-12 DIAGNOSIS — F419 Anxiety disorder, unspecified: Secondary | ICD-10-CM | POA: Diagnosis not present

## 2020-01-12 DIAGNOSIS — R41840 Attention and concentration deficit: Secondary | ICD-10-CM

## 2020-01-12 NOTE — Progress Notes (Signed)
Subjective:     Edwin Castro is a 19 y.o. male and is here for a comprehensive physical exam. The patient reports problems - anxiety, concentration.  Pt notes some improvement in anxiety.  Noticed increased anxiety when in the classroom setting.  Pt recently transferred from [place] to [facility] in [place].  Was in counseling while in [place].  Pt considering taking a gap year to focus on writing.  Pt notes having some difficulty with completing work/concentrating.  Considering testing.    Social History   Socioeconomic History  . Marital status: Single    Spouse name: Not on file  . Number of children: Not on file  . Years of education: Not on file  . Highest education level: Not on file  Occupational History  . Not on file  Tobacco Use  . Smoking status: Never Smoker  . Smokeless tobacco: Never Used  Substance and Sexual Activity  . Alcohol use: No  . Drug use: No  . Sexual activity: Never  Other Topics Concern  . Not on file  Social History Narrative   Eidan is in tenth grade at [employer]. He is doing well. He runs Track and plays Soccer.   Lives with his parents and younger sister.    Social Determinants of Health   Financial Resource Strain:   . Difficulty of Paying Living Expenses:   Food Insecurity:   . Worried About Charity fundraiser in the Last Year:   . Arboriculturist in the Last Year:   Transportation Needs:   . Film/video editor (Medical):   Marland Kitchen Lack of Transportation (Non-Medical):   Physical Activity:   . Days of Exercise per Week:   . Minutes of Exercise per Session:   Stress:   . Feeling of Stress :   Social Connections:   . Frequency of Communication with Friends and Family:   . Frequency of Social Gatherings with Friends and Family:   . Attends Religious Services:   . Active Member of Clubs or Organizations:   . Attends Archivist Meetings:   Marland Kitchen Marital Status:   Intimate Partner Violence:   . Fear of Current  or Ex-Partner:   . Emotionally Abused:   Marland Kitchen Physically Abused:   . Sexually Abused:    Health Maintenance  Topic Date Due  . HIV Screening  01/11/2021 (Originally 09/03/2014)  . TETANUS/TDAP  06/22/2029  . INFLUENZA VACCINE  Completed    The following portions of the patient's history were reviewed and updated as appropriate: allergies, current medications, past family history, past medical history, past social history, past surgical history and problem list.  Review of Systems Pertinent items noted in HPI and remainder of comprehensive ROS otherwise negative.   Objective:    BP 116/82 (BP Location: Left Arm, Patient Position: Sitting, Cuff Size: Normal)   Pulse 82   Temp (!) 96.7 F (35.9 C) (Temporal)   Ht 5\' 7"  (1.702 m)   Wt 161 lb 12.8 oz (73.4 kg)   SpO2 98%   BMI 25.34 kg/m  General appearance: alert, cooperative and no distress Head: Normocephalic, without obvious abnormality, atraumatic Eyes: conjunctivae/corneas clear. PERRL, EOM's intact. Fundi benign. Ears: normal TM's and external ear canals both ears Nose: Nares normal. Septum midline. Mucosa normal. No drainage or sinus tenderness. Throat: lips, mucosa, and tongue normal; teeth and gums normal Neck: no adenopathy, no carotid bruit, no JVD, supple, symmetrical, trachea midline and thyroid not enlarged, symmetric, no  tenderness/mass/nodules Lungs: clear to auscultation bilaterally Heart: regular rate and rhythm, S1, S2 normal, no murmur, click, rub or gallop Abdomen: soft, non-tender; bowel sounds normal; no masses,  no organomegaly Extremities: extremities normal, atraumatic, no cyanosis or edema Pulses: 2+ and symmetric Skin: Skin color, texture, turgor normal. No rashes or lesions Lymph nodes: Cervical, supraclavicular, and axillary nodes normal. Neurologic: Alert and oriented X 3, normal strength and tone. Normal symmetric reflexes. Normal coordination and gait    Assessment:    Healthy male exam with  anxiety.     Plan:     Anticipatory guidance given including wearing seatbelts, smoke detectors in the home, increasing physical activity, increasing p.o. intake of water and vegetables. 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

## 2020-01-12 NOTE — Patient Instructions (Signed)
Managing Anxiety, Adult °After being diagnosed with an anxiety disorder, you may be relieved to know why you have felt or behaved a certain way. You may also feel overwhelmed about the treatment ahead and what it will mean for your life. With care and support, you can manage this condition and recover from it. °How to manage lifestyle changes °Managing stress and anxiety ° °Stress is your body's reaction to life changes and events, both good and bad. Most stress will last just a few hours, but stress can be ongoing and can lead to more than just stress. Although stress can play a major role in anxiety, it is not the same as anxiety. Stress is usually caused by something external, such as a deadline, test, or competition. Stress normally passes after the triggering event has ended.  °Anxiety is caused by something internal, such as imagining a terrible outcome or worrying that something will go wrong that will devastate you. Anxiety often does not go away even after the triggering event is over, and it can become long-term (chronic) worry. It is important to understand the differences between stress and anxiety and to manage your stress effectively so that it does not lead to an anxious response. °Talk with your health care provider or a counselor to learn more about reducing anxiety and stress. He or she may suggest tension reduction techniques, such as: °· Music therapy. This can include creating or listening to music that you enjoy and that inspires you. °· Mindfulness-based meditation. This involves being aware of your normal breaths while not trying to control your breathing. It can be done while sitting or walking. °· Centering prayer. This involves focusing on a word, phrase, or sacred image that means something to you and brings you peace. °· Deep breathing. To do this, expand your stomach and inhale slowly through your nose. Hold your breath for 3-5 seconds. Then exhale slowly, letting your stomach muscles  relax. °· Self-talk. This involves identifying thought patterns that lead to anxiety reactions and changing those patterns. °· Muscle relaxation. This involves tensing muscles and then relaxing them. °Choose a tension reduction technique that suits your lifestyle and personality. These techniques take time and practice. Set aside 5-15 minutes a day to do them. Therapists can offer counseling and training in these techniques. The training to help with anxiety may be covered by some insurance plans. Other things you can do to manage stress and anxiety include: °· Keeping a stress/anxiety diary. This can help you learn what triggers your reaction and then learn ways to manage your response. °· Thinking about how you react to certain situations. You may not be able to control everything, but you can control your response. °· Making time for activities that help you relax and not feeling guilty about spending your time in this way. °· Visual imagery and yoga can help you stay calm and relax. ° °Medicines °Medicines can help ease symptoms. Medicines for anxiety include: °· Anti-anxiety drugs. °· Antidepressants. °Medicines are often used as a primary treatment for anxiety disorder. Medicines will be prescribed by a health care provider. When used together, medicines, psychotherapy, and tension reduction techniques may be the most effective treatment. °Relationships °Relationships can play a big part in helping you recover. Try to spend more time connecting with trusted friends and family members. Consider going to couples counseling, taking family education classes, or going to family therapy. Therapy can help you and others better understand your condition. °How to recognize changes in your   anxiety °Everyone responds differently to treatment for anxiety. Recovery from anxiety happens when symptoms decrease and stop interfering with your daily activities at home or work. This may mean that you will start to: °· Have  better concentration and focus. Worry will interfere less in your daily thinking. °· Sleep better. °· Be less irritable. °· Have more energy. °· Have improved memory. °It is important to recognize when your condition is getting worse. Contact your health care provider if your symptoms interfere with home or work and you feel like your condition is not improving. °Follow these instructions at home: °Activity °· Exercise. Most adults should do the following: °? Exercise for at least 150 minutes each week. The exercise should increase your heart rate and make you sweat (moderate-intensity exercise). °? Strengthening exercises at least twice a week. °· Get the right amount and quality of sleep. Most adults need 7-9 hours of sleep each night. °Lifestyle ° °· Eat a healthy diet that includes plenty of vegetables, fruits, whole grains, low-fat dairy products, and lean protein. Do not eat a lot of foods that are high in solid fats, added sugars, or salt. °· Make choices that simplify your life. °· Do not use any products that contain nicotine or tobacco, such as cigarettes, e-cigarettes, and chewing tobacco. If you need help quitting, ask your health care provider. °· Avoid caffeine, alcohol, and certain over-the-counter cold medicines. These may make you feel worse. Ask your pharmacist which medicines to avoid. °General instructions °· Take over-the-counter and prescription medicines only as told by your health care provider. °· Keep all follow-up visits as told by your health care provider. This is important. °Where to find support °You can get help and support from these sources: °· Self-help groups. °· Online and community organizations. °· A trusted spiritual leader. °· Couples counseling. °· Family education classes. °· Family therapy. °Where to find more information °You may find that joining a support group helps you deal with your anxiety. The following sources can help you locate counselors or support groups near  you: °· Mental Health [place]: www.mentalhealthamerica.net °· Anxiety and Depression Association of America (ADAA): www.adaa.org °· National Alliance on Mental Illness (NAMI): www.nami.org °Contact a health care provider if you: °· Have a hard time staying focused or finishing daily tasks. °· Spend many hours a day feeling worried about everyday life. °· Become exhausted by worry. °· Start to have headaches, feel tense, or have nausea. °· Urinate more than normal. °· Have diarrhea. °Get help right away if you have: °· A racing heart and shortness of breath. °· Thoughts of hurting yourself or others. °If you ever feel like you may hurt yourself or others, or have thoughts about taking your own life, get help right away. You can go to your nearest emergency department or call: °· Your local emergency services (911 in the [place] °· A suicide crisis helpline, such as the National Suicide Prevention Lifeline at 1-800-273-8255. This is open 24 hours a day. °Summary °· Taking steps to learn and use tension reduction techniques can help calm you and help prevent triggering an anxiety reaction. °· When used together, medicines, psychotherapy, and tension reduction techniques may be the most effective treatment. °· Family, friends, and partners can play a big part in helping you recover from an anxiety disorder. °This information is not intended to replace advice given to you by your health care provider. Make sure you discuss any questions you have with your health care provider. °Document Revised:   03/02/2019 Document Reviewed: 03/02/2019 °Elsevier Patient Education © 2020 [employer] Inc. ° ° °Attention Deficit Hyperactivity Disorder, Adult °Attention deficit hyperactivity disorder (ADHD) is a mental health disorder that starts during childhood (neurodevelopmental disorder). For many people with ADHD, the disorder continues into the adult years. Treatment can help you manage your symptoms. °What are the causes? °The exact  cause of ADHD is not known. Most experts believe genetics and environmental factors contribute to ADHD. °What increases the risk? °The following factors may make you more likely to develop this condition: °· Having a family history of ADHD. °· Being male. °· Being born to a mother who smoked or drank alcohol during pregnancy. °· Being exposed to lead or other toxins in the womb or early in life. °· Being born before 37 weeks of pregnancy (prematurely) or at a low birth weight. °· Having experienced a brain injury. °What are the signs or symptoms? °Symptoms of this condition depend on the type of ADHD. The two main types are inattentive and hyperactive-impulsive. Some people may have symptoms of both types. °Symptoms of the inattentive type include: °· Difficulty paying attention. °· Making careless mistakes. °· Not following instructions. °· Being disorganized. °· Avoiding tasks that require time and attention. °· Losing and forgetting things. °· Being easily distracted. °Symptoms of the hyperactive-impulsive type include: °· Restlessness. °· Talking too much. °· Interrupting. °· Difficulty with: °? Sitting still. °? Feeling motivated. °? Relaxing. °? Waiting in line or waiting for a turn. °In adults, this condition may lead to certain problems, such as: °· Keeping jobs. °· Performing tasks at work. °· Having stable relationships. °· Being on time or keeping to a schedule. °How is this diagnosed? °This condition is diagnosed based on your current symptoms and your history of symptoms. The diagnosis can be made by a health care provider such as a primary care provider or a mental health care specialist. °Your health care provider may use a symptom checklist or a behavior rating scale to evaluate your symptoms. He or she may also want to talk with people who have observed your behaviors throughout your life. °How is this treated? °This condition can be treated with medicines and behavior therapy. Medicines may be the  best option to reduce impulsive behaviors and improve attention. Your health care provider may recommend: °· Stimulant medicines. These are the most common medicines used for adult ADHD. They affect certain chemicals in the brain (neurotransmitters) and improve your ability to control your symptoms. °· A non-stimulant medicine for adult ADHD (atomoxetine). This medicine increases a neurotransmitter called norepinephrine. It may take weeks to months to see effects from this medicine. °Counseling and behavioral management are also important for treating ADHD. Counseling is often used along with medicine. Your health care provider may suggest: °· Cognitive behavioral therapy (CBT). This type of therapy teaches you to replace negative thoughts and actions with positive thoughts and actions. When used as part of ADHD treatment, this therapy may also include: °? Coping strategies for organization, time management, impulse control, and stress reduction. °? Mindfulness and meditation training. °· Behavioral management. You may work with a coach who is specially trained to help people with ADHD manage and organize activities and function more effectively. °Follow these instructions at home: °Medicines ° °· Take over-the-counter and prescription medicines only as told by your health care provider. °· Talk with your health care provider about the possible side effects of your medicines and how to manage them. °Lifestyle ° °· Do not use   drugs. °· Do not drink alcohol if: °? Your health care provider tells you not to drink. °? You are pregnant, may be pregnant, or are planning to become pregnant. °· If you drink alcohol: °? Limit how much you use to: °§ 0-1 drink a day for women. °§ 0-2 drinks a day for men. °? Be aware of how much alcohol is in your drink. In the [place] one drink equals one 12 oz bottle of beer (355 mL), one 5 oz glass of wine (148 mL), or one 1½ oz glass of hard liquor (44 mL). °· Get enough sleep. °· Eat a  healthy diet. °· Exercise regularly. Exercise can help to reduce stress and anxiety. °General instructions °· Learn as much as you can about adult ADHD, and work closely with your health care providers to find the treatments that work best for you. °· Follow the same schedule each day. °· Use reminder devices like notes, calendars, and phone apps to stay on time and organized. °· Keep all follow-up visits as told by your health care provider and therapist. This is important. °Where to find more information °A health care provider may be able to recommend resources that are available online or over the phone. You could start with: °· Attention Deficit Disorder Association (ADDA): www.add.org °· National Institute of Mental Health (NIMH): www.nimh.nih.gov °Contact a health care provider if: °· Your symptoms continue to cause problems. °· You have side effects from your medicine, such as: °? Repeated muscle twitches, coughing, or speech outbursts. °? Sleep problems. °? Loss of appetite. °? Dizziness. °? Unusually fast heartbeat. °? Stomach pains. °? Headaches. °· You are struggling with anxiety, depression, or substance abuse. °Get help right away if you: °· Have a severe reaction to a medicine. °If you ever feel like you may hurt yourself or others, or have thoughts about taking your own life, get help right away. You can go to the nearest emergency department or call: °· Your local emergency services (911 in the [place] °· A suicide crisis helpline, such as the National Suicide Prevention Lifeline at 1-800-273-8255. This is open 24 hours a day. °Summary °· ADHD is a mental health disorder that starts during childhood (neurodevelopmental disorder) and often continues into the adult years. °· The exact cause of ADHD is not known. Most experts believe genetics and environmental factors contribute to ADHD. °· There is no cure for ADHD, but treatment with medicine, cognitive behavioral therapy, or behavioral management can  help you manage your condition. °This information is not intended to replace advice given to you by your health care provider. Make sure you discuss any questions you have with your health care provider. °Document Revised: 02/22/2019 Document Reviewed: 02/22/2019 °Elsevier Patient Education © 2020 Elsevier Inc. ° °

## 2021-06-25 ENCOUNTER — Other Ambulatory Visit: Payer: Self-pay

## 2021-06-25 ENCOUNTER — Encounter: Payer: Self-pay | Admitting: Family Medicine

## 2021-06-25 ENCOUNTER — Ambulatory Visit (INDEPENDENT_AMBULATORY_CARE_PROVIDER_SITE_OTHER): Payer: BC Managed Care – PPO | Admitting: Family Medicine

## 2021-06-25 VITALS — BP 130/80 | HR 61 | Temp 97.9°F | Ht 69.0 in | Wt 166.2 lb

## 2021-06-25 DIAGNOSIS — Z23 Encounter for immunization: Secondary | ICD-10-CM | POA: Diagnosis not present

## 2021-06-25 DIAGNOSIS — G4700 Insomnia, unspecified: Secondary | ICD-10-CM | POA: Diagnosis not present

## 2021-06-25 DIAGNOSIS — M238X2 Other internal derangements of left knee: Secondary | ICD-10-CM | POA: Diagnosis not present

## 2021-06-25 DIAGNOSIS — Z0000 Encounter for general adult medical examination without abnormal findings: Secondary | ICD-10-CM | POA: Diagnosis not present

## 2021-06-25 NOTE — Patient Instructions (Signed)
Try over-the-counter melatonin 3 mg at night to see if it helps with her sleep.  You can take melatonin as early as 7 PM.  Also try keeping a notepad beside the bed to write down thoughts he may be having.  Try this for a few weeks and let me know if it is helping.  If your knee continues begins to hurt or swell let us know.

## 2021-06-25 NOTE — Progress Notes (Signed)
Subjective:     Edwin Castro is a 20 y.o. male and is here for a comprehensive physical exam. The patient reports L knee makes loud grinding noise with bending/squatting.  Does not really hurt.  At times may feel uncomfortable.  Pt doing more walking since back in school after taking a yr off.  Trying to get back into the routine of being in scooPatient notes increased anxiety last year seems to be improving.  Occasional difficulty sleeping.  In bed by 11 or 12 and up by 8 AM.  At times feels like mind and body are not working together/mind races.  Social History   Socioeconomic History   Marital status: Single    Spouse name: Not on file   Number of children: Not on file   Years of education: Not on file   Highest education level: Not on file  Occupational History   Not on file  Tobacco Use   Smoking status: Never   Smokeless tobacco: Never  Substance and Sexual Activity   Alcohol use: No   Drug use: No   Sexual activity: Never  Other Topics Concern   Not on file  Social History Narrative   Edwin Castro is in tenth grade at [employer]. He is doing well. He runs Track and plays Soccer.   Lives with his parents and younger sister.    Social Determinants of Health   Financial Resource Strain: Not on file  Food Insecurity: Not on file  Transportation Needs: Not on file  Physical Activity: Not on file  Stress: Not on file  Social Connections: Not on file  Intimate Partner Violence: Not on file   Health Maintenance  Topic Date Due   HPV VACCINES (1 - Male 2-dose series) Never done   HIV Screening  Never done   Hepatitis C Screening  Never done   INFLUENZA VACCINE  05/14/2021   TETANUS/TDAP  06/22/2029   Pneumococcal Vaccine 61-56 Years old  Aged Out    The following portions of the patient's history were reviewed and updated as appropriate: allergies, current medications, past family history, past medical history, past social history, past surgical history, and problem  list.  Review of Systems Pertinent items noted in HPI and remainder of comprehensive ROS otherwise negative.   Objective:    BP 130/80 (BP Location: Right Arm, Patient Position: Sitting, Cuff Size: Normal)   Pulse 61   Temp 97.9 F (36.6 C) (Oral)   Ht 5\' 9"  (1.753 m)   Wt 166 lb 3.2 oz (75.4 kg)   SpO2 97%   BMI 24.54 kg/m  General appearance: alert, cooperative, and no distress Head: Normocephalic, without obvious abnormality, atraumatic Eyes: conjunctivae/corneas clear. PERRL, EOM's intact. Fundi benign. Ears: normal TM's and external ear canals both ears Nose: Nares normal. Septum midline. Mucosa normal. No drainage or sinus tenderness. Throat: lips, mucosa, and tongue normal; teeth and gums normal Neck: no adenopathy, no carotid bruit, no JVD, supple, symmetrical, trachea midline, and thyroid not enlarged, symmetric, no tenderness/mass/nodules Lungs: clear to auscultation bilaterally Heart: regular rate and rhythm, S1, S2 normal, no murmur, click, rub or gallop Abdomen: soft, non-tender; bowel sounds normal; no masses,  no organomegaly Extremities: extremities normal, atraumatic, no cyanosis or edema.  L knee with crepitus.  No TTP of joint line or effusion.  No patellar dislocation. Pulses: 2+ and symmetric Skin: Skin color, texture, turgor normal. No rashes or lesions Lymph nodes: Cervical, supraclavicular, and axillary nodes normal. Neurologic: Alert and oriented X  3, normal strength and tone. Normal symmetric reflexes. Normal coordination and gait    PHQ9 SCORE ONLY 06/25/2021 01/12/2020 06/23/2019  PHQ-9 Total Score 9 13 3    GAD 7 : Generalized Anxiety Score 06/25/2021 01/12/2020 10/20/2018  Nervous, Anxious, on Edge 1 3 3   Control/stop worrying 1 2 2   Worry too much - different things 1 2 2   Trouble relaxing 0 2 2  Restless 0 1 1  Easily annoyed or irritable 0 1 2  Afraid - awful might happen 1 2 3   Total GAD 7 Score 4 13 15   Anxiety Difficulty Not difficult at all -  Very difficult     Assessment:    Healthy male exam.      Plan:    Anticipatory guidance given including wearing seatbelts, smoke detectors in the home, increasing physical activity, increasing p.o. intake of water and vegetables. -PHQ-9 and GAD-7 score improving.  Continue self-care.  Consider counseling. -Discussed labs including lipid panel.  We will wait at this time -Discussed immunizations. -Given handout -Next CPE in 1 year -See After Visit Summary for Counseling Recommendations  Need for influenza vaccination -Influenza vaccine given this visit  Crepitus of joint of left knee -Discussed stretching exercises and strengthening exercises -Supportive care as needed.  Consider insoles and shoes for better arch support -Given handout of exercises  Insomnia, unspecified type -PHQ-9 score 9 -GAD-7 score 4 -Discussed sleep hygiene -Can try OTC melatonin 3 mg nightly -Discussed exercise during the day, limiting caffeine and other ways to help improve sleep -Follow-up in the next few weeks for continued or worsening symptoms  Follow-up as needed in the next few months  12/19/2018, MD

## 2022-02-13 DIAGNOSIS — L700 Acne vulgaris: Secondary | ICD-10-CM | POA: Diagnosis not present

## 2022-03-13 DIAGNOSIS — Z79899 Other long term (current) drug therapy: Secondary | ICD-10-CM | POA: Diagnosis not present

## 2022-03-13 DIAGNOSIS — L700 Acne vulgaris: Secondary | ICD-10-CM | POA: Diagnosis not present

## 2022-03-26 DIAGNOSIS — Z79899 Other long term (current) drug therapy: Secondary | ICD-10-CM | POA: Diagnosis not present

## 2022-03-26 DIAGNOSIS — L700 Acne vulgaris: Secondary | ICD-10-CM | POA: Diagnosis not present

## 2022-08-26 DIAGNOSIS — Z13220 Encounter for screening for lipoid disorders: Secondary | ICD-10-CM | POA: Diagnosis not present

## 2022-08-26 DIAGNOSIS — Z23 Encounter for immunization: Secondary | ICD-10-CM | POA: Diagnosis not present

## 2022-08-26 DIAGNOSIS — Z0000 Encounter for general adult medical examination without abnormal findings: Secondary | ICD-10-CM | POA: Diagnosis not present

## 2022-11-11 DIAGNOSIS — F4322 Adjustment disorder with anxiety: Secondary | ICD-10-CM | POA: Diagnosis not present

## 2022-12-13 DIAGNOSIS — D72819 Decreased white blood cell count, unspecified: Secondary | ICD-10-CM | POA: Diagnosis not present

## 2023-02-15 DIAGNOSIS — L700 Acne vulgaris: Secondary | ICD-10-CM | POA: Diagnosis not present

## 2023-02-20 ENCOUNTER — Telehealth: Payer: Self-pay

## 2023-02-20 NOTE — Telephone Encounter (Signed)
LVM for patient to call back. AS, CMA 

## 2023-09-10 DIAGNOSIS — Z0000 Encounter for general adult medical examination without abnormal findings: Secondary | ICD-10-CM | POA: Diagnosis not present

## 2023-09-10 DIAGNOSIS — Z113 Encounter for screening for infections with a predominantly sexual mode of transmission: Secondary | ICD-10-CM | POA: Diagnosis not present

## 2023-09-10 DIAGNOSIS — Z23 Encounter for immunization: Secondary | ICD-10-CM | POA: Diagnosis not present

## 2023-10-20 ENCOUNTER — Telehealth: Payer: Self-pay | Admitting: Family Medicine

## 2023-10-20 NOTE — Telephone Encounter (Signed)
 Called Pt to schedule a CPE.  LVM for Pt to call us back.
